# Patient Record
Sex: Female | Born: 1970 | Hispanic: Yes | Marital: Married | Smoking: Never smoker
Health system: Southern US, Community
[De-identification: ages and names within clinical notes are randomized; demographics above are authoritative.]

---

## 2009-03-25 ENCOUNTER — Ambulatory Visit: Payer: Self-pay | Admitting: Certified Nurse Midwife

## 2009-06-24 ENCOUNTER — Ambulatory Visit: Payer: Self-pay | Admitting: Certified Nurse Midwife

## 2009-08-20 ENCOUNTER — Inpatient Hospital Stay: Payer: Self-pay | Admitting: Obstetrics and Gynecology

## 2009-10-15 ENCOUNTER — Ambulatory Visit: Payer: Self-pay | Admitting: Certified Nurse Midwife

## 2013-11-05 ENCOUNTER — Ambulatory Visit: Payer: Self-pay

## 2014-10-19 ENCOUNTER — Ambulatory Visit: Payer: Self-pay

## 2015-04-30 ENCOUNTER — Ambulatory Visit
Admission: RE | Admit: 2015-04-30 | Discharge: 2015-04-30 | Disposition: A | Discharge status: Home / Self Care | Payer: Self-pay | Source: Ambulatory Visit | Attending: Family Medicine | Admitting: Family Medicine

## 2015-04-30 ENCOUNTER — Other Ambulatory Visit: Payer: Self-pay | Admitting: Family Medicine

## 2015-04-30 DIAGNOSIS — M25512 Pain in left shoulder: Secondary | ICD-10-CM

## 2017-02-14 ENCOUNTER — Other Ambulatory Visit: Payer: Self-pay | Admitting: Family Medicine

## 2017-03-19 ENCOUNTER — Ambulatory Visit: Payer: Self-pay | Attending: Oncology

## 2017-05-16 ENCOUNTER — Ambulatory Visit: Payer: Self-pay

## 2017-07-18 ENCOUNTER — Ambulatory Visit: Payer: Self-pay | Attending: Oncology

## 2017-07-18 ENCOUNTER — Ambulatory Visit
Admission: RE | Admit: 2017-07-18 | Discharge: 2017-07-18 | Disposition: A | Discharge status: Home / Self Care | Payer: Self-pay | Source: Ambulatory Visit | Attending: Oncology | Admitting: Oncology

## 2017-07-18 VITALS — BP 114/74 | HR 83 | Temp 98.9°F | Ht 63.0 in | Wt 112.0 lb

## 2017-07-18 DIAGNOSIS — Z0000 Encounter for general adult medical examination without abnormal findings: Secondary | ICD-10-CM

## 2017-07-18 NOTE — Progress Notes (Signed)
Subjective:     Patient ID: Breanna Hamilton, female   DOB: 25-Sep-1970, 46 y.o.   MRN: [id number]  HPI   Review of Systems     Objective:   Physical Exam  Pulmonary/Chest: Right breast exhibits no inverted nipple, no mass, no nipple discharge, no skin change and no tenderness. Left breast exhibits no inverted nipple, no mass, no nipple discharge, no skin change and no tenderness. Breasts are symmetrical.       Assessment:     46 year old hispanic patient presents for [facility] clinic.  Patient screened, and meets [facility] eligibility.  Patient does not have insurance, Medicare or Medicaid.  Handout given on Affordable Care Act. Instructed patient on breast self-exam using teach back method.  CBE unremarkable.  No mass or lump palpated. Hiram interpreted exam    Plan:     Sent for bilateral screening mammogram.

## 2017-07-23 ENCOUNTER — Other Ambulatory Visit: Payer: Self-pay | Admitting: *Deleted

## 2017-07-23 DIAGNOSIS — N6489 Other specified disorders of breast: Secondary | ICD-10-CM

## 2017-08-02 ENCOUNTER — Ambulatory Visit
Admission: RE | Admit: 2017-08-02 | Discharge: 2017-08-02 | Disposition: A | Discharge status: Home / Self Care | Payer: Self-pay | Source: Ambulatory Visit | Attending: Oncology | Admitting: Oncology

## 2017-08-02 DIAGNOSIS — N6489 Other specified disorders of breast: Secondary | ICD-10-CM

## 2017-08-03 NOTE — Progress Notes (Signed)
Radiologist reviewed benign findings with patient. Letter mailed from [facility] to notify of normal mammogram results.  Patient to return in one year for annual screening. Copy to [facility].

## 2018-11-11 ENCOUNTER — Ambulatory Visit: Payer: Self-pay

## 2018-11-27 ENCOUNTER — Ambulatory Visit: Payer: Self-pay

## 2019-02-19 IMAGING — MG MM DIGITAL SCREENING BILAT W/ TOMO W/ CAD
9 of 13 series · 9 of 29 positions shown · non-contrast
Comparison: Previous exam(s).

CLINICAL DATA: Screening.

EXAM:
2D DIGITAL SCREENING BILATERAL MAMMOGRAM WITH CAD AND ADJUNCT TOMO

[L MLO (1 of 2)]
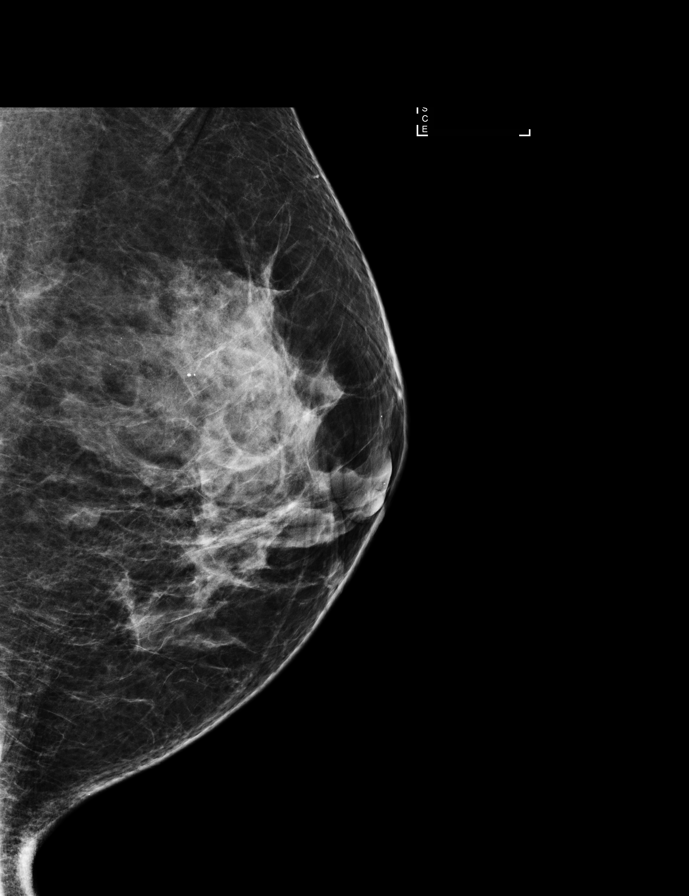

[R CC]
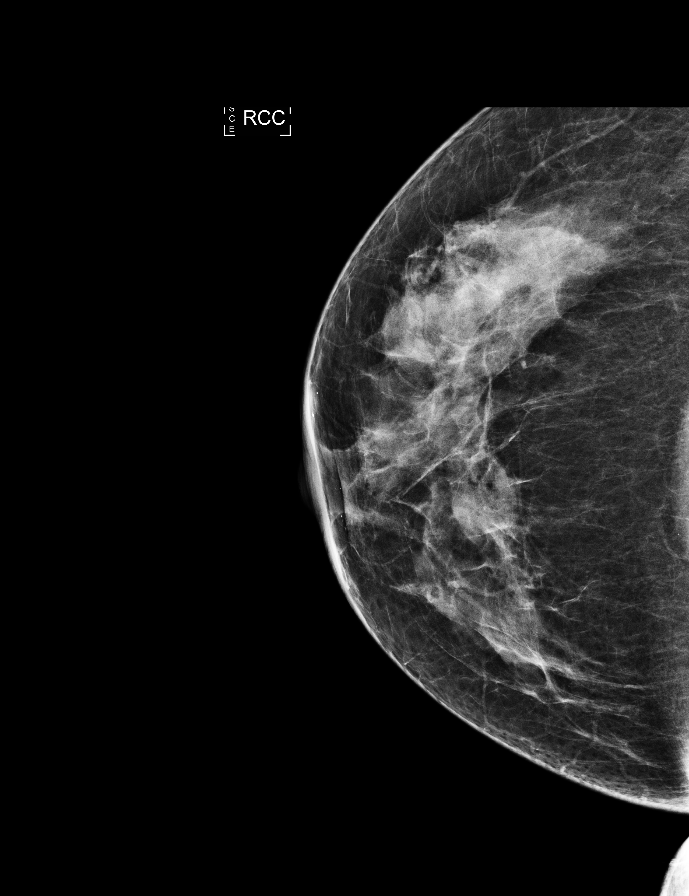

[L CC synth-2D]
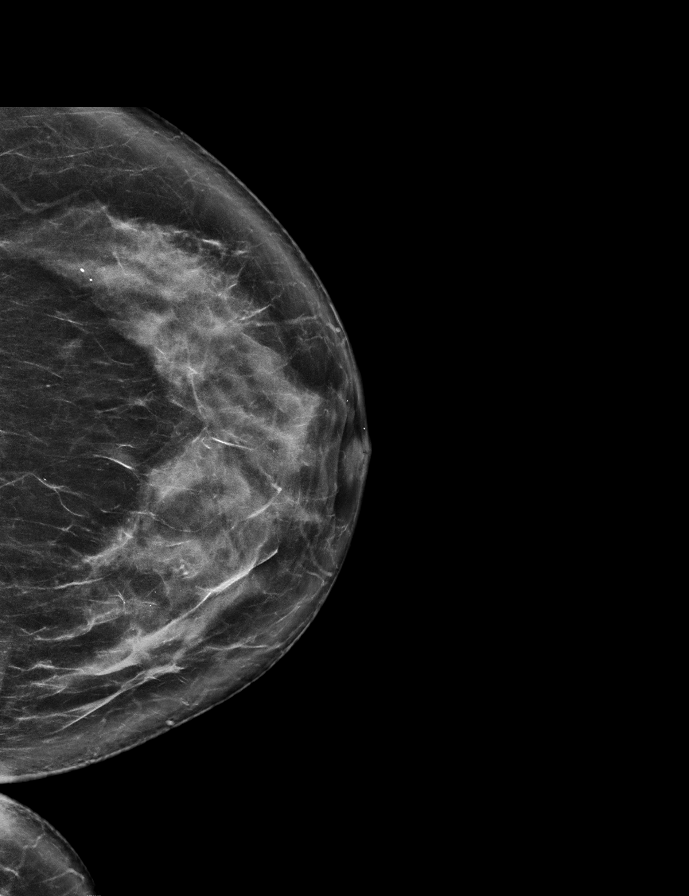

[R MLO]
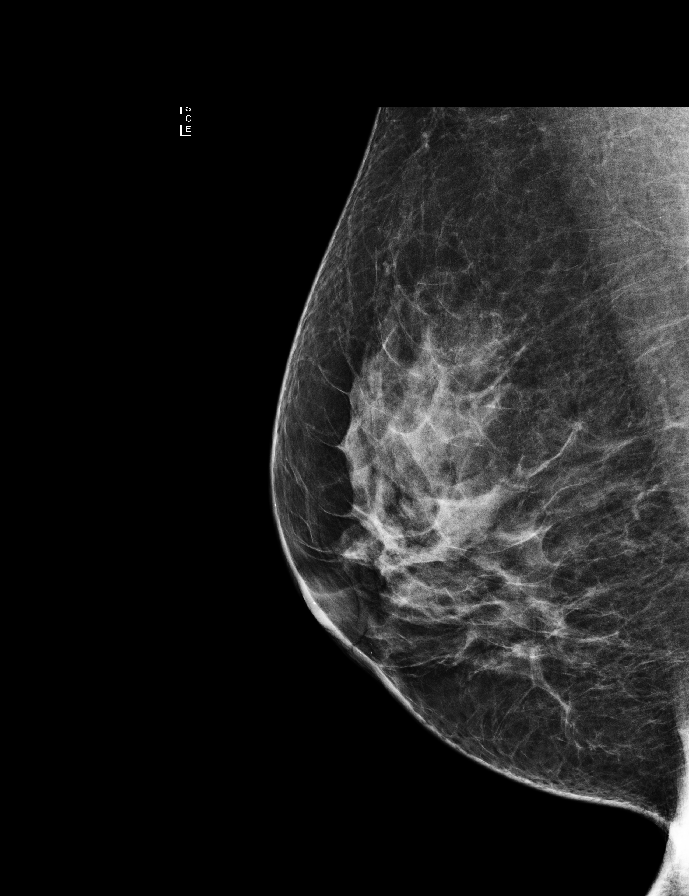

[R CC synth-2D]
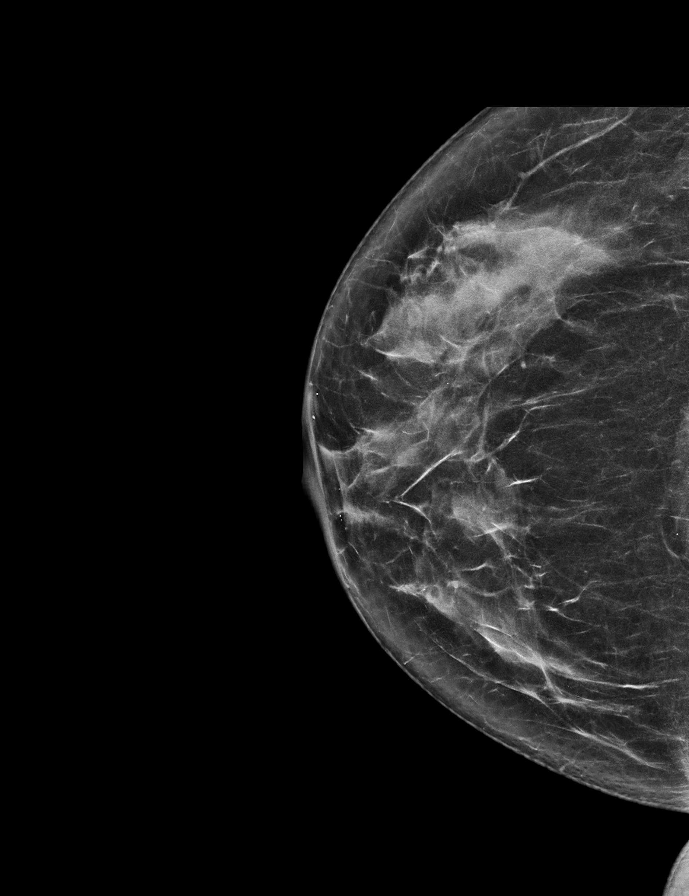

[R MLO synth-2D]
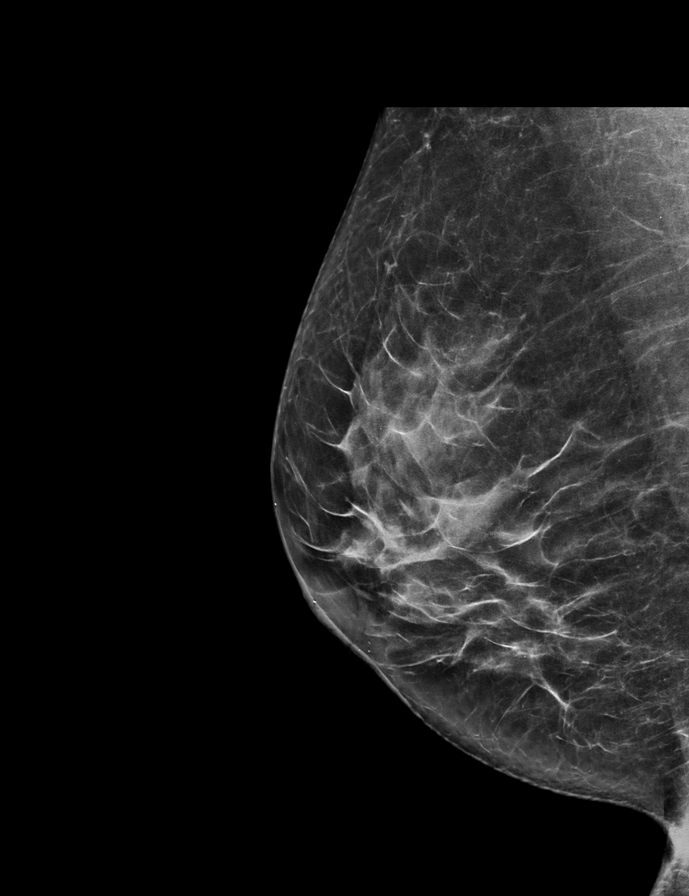

[L MLO synth-2D]
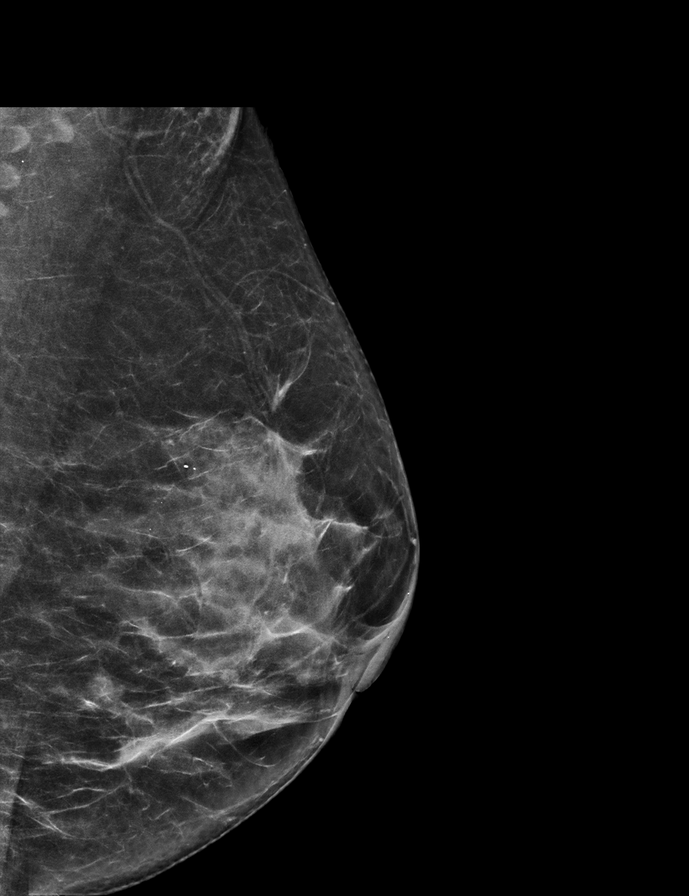

[L CC]
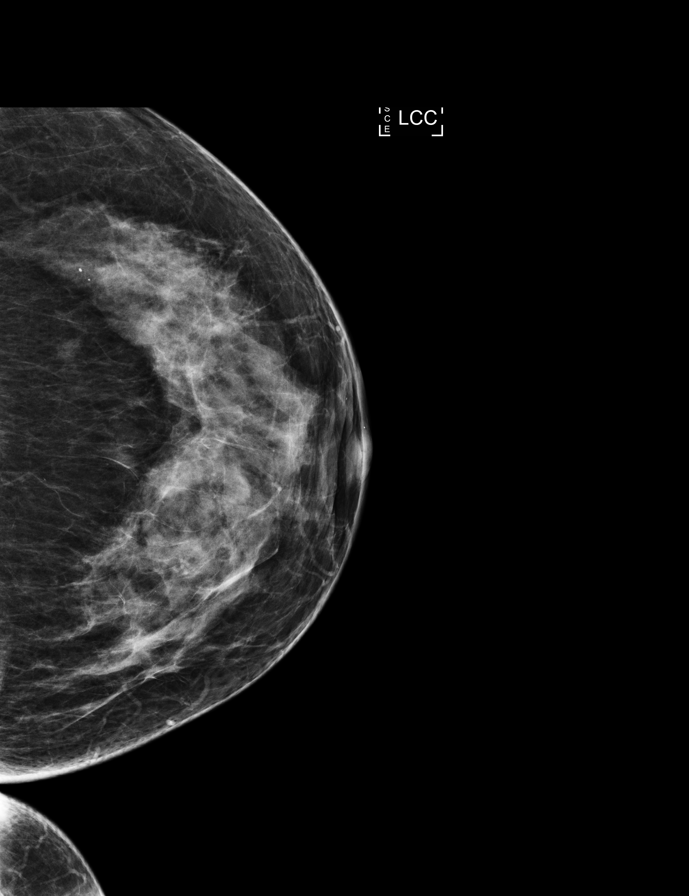

[L MLO (2 of 2)]
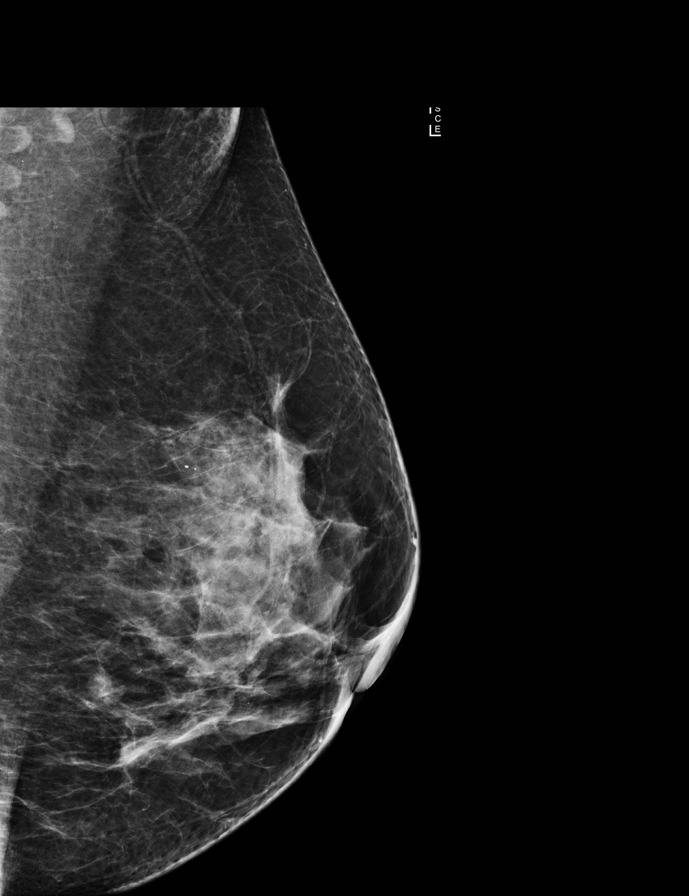

[9 of 29 positions shown; findings below may reference images not displayed]

ACR Breast Density Category c: The breast tissue is heterogeneously
dense, which may obscure small masses.
FINDINGS: In the left breast, a possible asymmetry warrants further
evaluation. In the right breast, no findings suspicious for
malignancy. Images were processed with CAD.
IMPRESSION: Further evaluation is suggested for possible asymmetry in the left
breast.

RECOMMENDATION:
Diagnostic mammogram and possibly ultrasound of the left breast.
(Code:82-[id number])

The patient will be contacted regarding the findings, and additional
imaging will be scheduled.

BI-RADS CATEGORY  0: Incomplete. Need additional imaging evaluation
and/or prior mammograms for comparison.

## 2019-05-06 ENCOUNTER — Other Ambulatory Visit: Payer: Self-pay

## 2019-05-06 ENCOUNTER — Telehealth: Payer: Self-pay

## 2019-05-06 NOTE — Telephone Encounter (Signed)
Attempted pre-visit screening call ([facility] appointment 05/07/2019) with interpreter Jacqui. No answer / no voicemail at home #. A person called back to the interpreter line saying that she was unavailable.

## 2019-05-07 ENCOUNTER — Other Ambulatory Visit: Payer: Self-pay | Admitting: *Deleted

## 2019-05-07 ENCOUNTER — Ambulatory Visit: Payer: Self-pay | Attending: Oncology | Admitting: *Deleted

## 2019-05-07 ENCOUNTER — Encounter: Payer: Self-pay | Admitting: *Deleted

## 2019-05-07 ENCOUNTER — Other Ambulatory Visit: Payer: Self-pay

## 2019-05-07 VITALS — BP 127/79 | HR 64 | Temp 99.0°F | Ht 62.0 in | Wt 103.0 lb

## 2019-05-07 DIAGNOSIS — N644 Mastodynia: Secondary | ICD-10-CM

## 2019-05-07 NOTE — Progress Notes (Signed)
  Subjective:     Patient ID: Breanna Hamilton, female   DOB: [birth year], 48 y.o.   MRN: [id number]  HPI   Review of Systems     Objective:   Physical Exam Chest:     Breasts:        Right: No swelling, bleeding, inverted nipple, mass, nipple discharge, skin change or tenderness.        Left: Tenderness present. No swelling, bleeding, inverted nipple, mass, nipple discharge or skin change.    Lymphadenopathy:     Upper Body:     Right upper body: No supraclavicular or axillary adenopathy.     Left upper body: No supraclavicular or axillary adenopathy.        Assessment:     48 year old Hispanic female returns to [facility] for annual screening.  Breanna Hamilton, the interpreter present during the interview and exam.  Patient complains of intermittent left breast pain. States it occurs daily, multiple times a day, lasting only seconds.  Rates the pain a 4 on a 0/10 scale.  Denies any aggravating or alleviating factors.  Patient has a very flat affect and is a poor historian.  On clinical breast exam I can palpate her ribs at the area of concern.  The rib area is tender to palpation.  There is no dominant mass, skin changes, nipple discharge or lymphadenopathy.  She denies any trauma to the area.  Taught self breast awareness.  Last pap on 01/29/17 was negative / no HPV co-testing.  Next pap due in 2021.  Patient has been screened for eligibility.  She does not have any insurance, Medicare or Medicaid.  She also meets financial eligibility.  Hand-out given on the Affordable Care Act.  Risk Assessment    Risk Scores      05/07/2019   Last edited by: Breanna Demark, RN   5-year risk: 0.7 %   Lifetime risk: 7.4 %            Plan:     Will order diagnostic mammogram and ultrasound for targeted breast pain.  Breanna Hamilton to call patient with her appointment.

## 2019-05-07 NOTE — Patient Instructions (Signed)
Gave patient hand-out, Women Staying Healthy, Active and Well from [facility], with education on breast health, pap smears, heart and colon health. 

## 2019-05-08 ENCOUNTER — Telehealth: Payer: Self-pay | Admitting: *Deleted

## 2019-05-16 ENCOUNTER — Encounter: Payer: Self-pay | Admitting: Radiology

## 2019-05-16 ENCOUNTER — Ambulatory Visit
Admission: RE | Admit: 2019-05-16 | Discharge: 2019-05-16 | Disposition: A | Discharge status: Home / Self Care | Payer: Self-pay | Source: Ambulatory Visit | Attending: Oncology | Admitting: Oncology

## 2019-05-16 DIAGNOSIS — N644 Mastodynia: Secondary | ICD-10-CM

## 2019-05-21 ENCOUNTER — Encounter: Payer: Self-pay | Admitting: *Deleted

## 2019-05-21 NOTE — Progress Notes (Signed)
Letter mailed from the [place] to inform patient of her normal mammogram results.  Since no findings on clinical breast exam and normal mammogram,  patient is to follow-up with annual screening in one year.

## 2020-01-20 DIAGNOSIS — Z124 Encounter for screening for malignant neoplasm of cervix: Secondary | ICD-10-CM | POA: Diagnosis not present

## 2020-01-21 ENCOUNTER — Other Ambulatory Visit: Payer: Self-pay | Admitting: Primary Care

## 2020-01-21 DIAGNOSIS — N644 Mastodynia: Secondary | ICD-10-CM

## 2020-02-12 ENCOUNTER — Encounter: Payer: Self-pay | Admitting: Family Medicine

## 2020-02-12 ENCOUNTER — Ambulatory Visit
Admission: RE | Admit: 2020-02-12 | Discharge: 2020-02-12 | Disposition: A | Discharge status: Home / Self Care | Payer: Managed Care, Other (non HMO) | Source: Ambulatory Visit | Attending: Primary Care | Admitting: Primary Care

## 2020-02-12 DIAGNOSIS — N644 Mastodynia: Secondary | ICD-10-CM

## 2020-02-12 DIAGNOSIS — R922 Inconclusive mammogram: Secondary | ICD-10-CM | POA: Diagnosis not present

## 2020-04-20 ENCOUNTER — Telehealth: Payer: Self-pay | Admitting: Obstetrics & Gynecology

## 2020-04-20 NOTE — Telephone Encounter (Signed)
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 Called and left voicemail for patient to call back to be scheduled.

## 2020-05-18 ENCOUNTER — Ambulatory Visit (INDEPENDENT_AMBULATORY_CARE_PROVIDER_SITE_OTHER): Payer: Managed Care, Other (non HMO) | Admitting: Obstetrics and Gynecology

## 2020-05-18 ENCOUNTER — Other Ambulatory Visit: Payer: Self-pay

## 2020-05-18 ENCOUNTER — Encounter: Payer: Self-pay | Admitting: Obstetrics and Gynecology

## 2020-05-18 VITALS — BP 130/72 | HR 83 | Resp 16 | Ht 60.0 in | Wt 101.6 lb

## 2020-05-18 DIAGNOSIS — N760 Acute vaginitis: Secondary | ICD-10-CM

## 2020-05-18 DIAGNOSIS — N8110 Cystocele, unspecified: Secondary | ICD-10-CM

## 2020-05-18 DIAGNOSIS — R300 Dysuria: Secondary | ICD-10-CM

## 2020-05-18 DIAGNOSIS — R32 Unspecified urinary incontinence: Secondary | ICD-10-CM

## 2020-05-18 NOTE — Patient Instructions (Signed)
Menopausia y terapia de reemplazo hormonal Menopause and Hormone Replacement Therapy La menopausia es la etapa normal de la vida cuando los perodos menstruales desaparecen y los ovarios dejan de producir las hormonas femeninas estrgeno y Education officer, museum. Esta falta de hormonas puede afectar la salud y causar sntomas indeseables. La terapia de reemplazo hormonal (TRH) puede aliviar algunos de esos sntomas. Qu es la terapia de reemplazo hormonal? La terapia de reemplazo hormonal (TRH) es el uso de hormonas artificiales (sintticas) para Microbiologist las hormonas que el cuerpo deja de producir al llegar a la menopausia. Cules son mis opciones para Neomia Dear TRH?  La TRH puede consistir en hormonas sintticas de Astronomer y Education officer, museum o puede consistir solamente en estrgeno (terapia de estrgenos solamente). Usted y su mdico decidirn qu tipo de TRH es la ms [place] para usted. Si elige Winferd Humphrey TRH y tiene tero, generalmente se prescribe estrgeno y progesterona. La terapia de estrgenos solamente se [place] para las mujeres que no tienen tero. Las siguientes son algunas de las opciones para Neomia Dear TRH:  Pastillas.  Parches.  Geles.  Aerosoles.  Cremas vaginales.  Anillo vaginal.  Dispositivos vaginales. La cantidad de hormonas que tome y el tiempo que deba tomarlas vara en funcin de su salud. Es importante lo siguiente:  Comenzar una TRH con la dosis ms baja posible.  Interrumpa la TRH tan pronto como su mdico se lo indique.  Colabore con su mdico para estar bien informada y sentirse cmoda con sus decisiones. Cules son los beneficios de la TRH? La TRH puede reducir la frecuencia y la gravedad de los sntomas de la menopausia. Los beneficios de la TRH varan segn el tipo de sntomas que tenga, su gravedad y su salud general. La TRH puede ayudarle a mejorar los siguientes sntomas de la menopausia:  Acaloramiento y sudores nocturnos. Estos consisten en una sensacin de calor  que se extiende por el rostro y el cuerpo. La piel se puede volver roja, como ruborizada. Los sudores nocturnos son acaloramientos que ocurren al estar dormida o tratando de dormir.  Prdida de masa sea (osteoporosis). El organismo pierde calcio ms rpido despus de la menopausia, lo que debilita a los [place]. Esto puede aumentar el riesgo de huesos rotos (fracturas).  Sequedad vaginal. La membrana que recubre la vagina puede volverse ms delgada y [place], lo que puede causar dolor durante las relaciones sexuales o causar infecciones, ardor o picazn.  Infecciones de las vas urinarias.  Incontinencia urinaria. Es la incapacidad para Producer, television/film/video.  Irritabilidad.  Problemas de memoria de [employer] Cules son los riesgos de la TRH? Los riesgos de la TRH pueden variar segn su salud y sus antecedentes mdicos. Los riesgos de la TRH tambin dependen de si usted recibe estrgeno y progesterona o solamente estrgeno. La TRH puede aumentar el riesgo de:  Tener prdidas. Estas ocurren cuando la vagina pierde repentinamente una pequea cantidad de [place].  Cncer de endometrio. Este cncer ocurre en la membrana del tero (endometrio).  Cncer de mama.  Aumenta la densidad del tejido de las [place]. Esto puede dificultar la deteccin de cncer de mamas con una radiografa de mamas (mamografa).  Accidente cerebrovascular.  Enfermedad cardaca.  Cogulos de [place].  Enfermedad de la vescula biliar.  Enfermedad heptica. Los riesgos de la TRH pueden ser [employer] si tiene alguna de las siguientes afecciones:  Database administrator de endometrio.  Enfermedad heptica.  Enfermedad cardaca.  Cncer de mama.  Antecedentes de cogulos sanguneos.  Antecedentes de accidente cerebrovascular. Siga estas instrucciones en  su casa:  Tome los medicamentos de venta libre y los recetados solamente como se lo haya indicado el mdico.  Hgase mamografas, exmenes plvicos y chequeos con la  frecuencia que le indique el mdico.  Hgase pruebas de prueba de Papanicolaou con la frecuencia que le indique el mdico. A esta prueba tambin se la denomina Pap. Es una prueba de deteccin que se [place] para detectar signos de cncer en el cuello uterino y la vagina. La prueba de prueba de Papanicolaou tambin puede identificar la presencia de infeccin o cambios precancerosos. Las pruebas de Papanicolaou se pueden realizar con la siguiente frecuencia: ? Cada 3 aos, a partir de los [address]. ? Cada 5 aos, a partir de los [address] [place], en combinacin con las pruebas que se realizan para Landscape architect presencia del virus del Geneticist, molecular (VPH). ? Con mayor o menor frecuencia segn las afecciones mdicas que tenga, su edad y otros factores de [place].  Es su responsabilidad retirar los [employer] de la prueba de Papanicolaou. Consulte al mdico o pregunte en el departamento donde se realiza la prueba cundo estarn [employer].  Concurra a todas las visitas de [address] se lo haya indicado el mdico. Esto es importante. Comunquese con un mdico si tiene:  Dolor o hinchazn en las piernas.  Falta de aire.  Dolor en el pecho.  Bultos o cambios en las mamas o en las [place].  Habla arrastrando las palabras.  Dolor, ardor o sangrado al [employer].  Hemorragia vaginal inusual.  Mareos o dolores de [place].  Adormecimiento o debilidad en cualquier parte de los brazos o de las piernas.  Dolor en el abdomen. Resumen  La menopausia es la etapa normal de la vida cuando los perodos [employer] desaparecen y los ovarios dejan de producir las hormonas femeninas estrgeno y Education officer, museum.  La terapia de reemplazo hormonal (TRH) puede aliviar algunos de los sntomas de la menopausia.  La TRH puede reducir la frecuencia y la gravedad de los sntomas de la menopausia.  Los riesgos de la TRH pueden variar segn su salud y sus antecedentes mdicos. Esta informacin no tiene Microbiologist el consejo del mdico. Asegrese de hacerle al mdico cualquier pregunta que tenga. Document Revised: 05/09/2018 Document Reviewed: 05/09/2018 Elsevier Patient Education  2020 [employer].

## 2020-05-18 NOTE — Progress Notes (Signed)
   Patient ID: Breanna Hamilton, female   DOB: 02-07-1971, 49 y.o.   MRN: [id number]  Reason for Consult: Gynecologic Exam   Referred by Sandrea Hughs, NP  Subjective:     HPI:  Breanna Hamilton is a 49 y.o. female. She reports symptoms of hot flashes. She notes enuresis. She notes vaginal pressure. She also notes vulvar pain with intercourse.  History reviewed. No pertinent past medical history. Family History  Problem Relation Age of Onset  . Cancer Mother    History reviewed. No pertinent surgical history.  Short Social History:  Social History   Tobacco Use  . Smoking status: Never Smoker  . Smokeless tobacco: Never Used  Substance Use Topics  . Alcohol use: Never    No Known Allergies  No current outpatient medications on file.   No current facility-administered medications for this visit.    REVIEW OF SYSTEMS      Objective:  Objective   Vitals:   05/18/20 1538  BP: 130/72  Pulse: 83  Resp: 16  SpO2: 99%  Weight: 101 lb 9.6 oz (46.1 kg)  Height: 5' (1.524 m)   Body mass index is 19.84 kg/m.  Physical Exam      Assessment/Plan:     49 yo Stage 2 prolapse- not symptomatic Trial of steroids for lichen sclerosis started at [facility] Will check for infection  Reports enuresis- will check with urology Urine culture today  Discussed HRT for hot flashes and night sweats if desired.    Adelene Idler MD [facility], [facility] 05/18/2020 4:38 PM

## 2020-05-19 DIAGNOSIS — R32 Unspecified urinary incontinence: Secondary | ICD-10-CM | POA: Diagnosis not present

## 2020-05-21 LAB — URINE CULTURE

## 2020-05-21 NOTE — Progress Notes (Signed)
Called pt, no answer, [facility]. 

## 2020-05-21 NOTE — Progress Notes (Signed)
Please call patient and inform her of negative urine culture. Thank you.

## 2020-05-22 LAB — NUSWAB BV AND CANDIDA, NAA
Atopobium vaginae: HIGH Score — AB
BVAB 2: HIGH Score — AB
Candida albicans, NAA: NEGATIVE
Candida glabrata, NAA: NEGATIVE
Megasphaera 1: HIGH Score — AB

## 2020-05-24 NOTE — Progress Notes (Signed)
Called pt, no answer, [facility]. 

## 2020-06-03 ENCOUNTER — Other Ambulatory Visit: Payer: Self-pay | Admitting: Obstetrics and Gynecology

## 2020-07-29 ENCOUNTER — Encounter: Payer: Self-pay | Admitting: Obstetrics and Gynecology

## 2020-07-29 ENCOUNTER — Other Ambulatory Visit: Payer: Self-pay

## 2020-07-29 ENCOUNTER — Ambulatory Visit (INDEPENDENT_AMBULATORY_CARE_PROVIDER_SITE_OTHER): Payer: 59 | Admitting: Obstetrics and Gynecology

## 2020-07-29 VITALS — BP 110/70 | Ht 60.0 in | Wt 99.8 lb

## 2020-07-29 DIAGNOSIS — L900 Lichen sclerosus et atrophicus: Secondary | ICD-10-CM

## 2020-07-29 MED ORDER — CLOBETASOL PROPIONATE 0.05 % EX OINT: TOPICAL_OINTMENT | CUTANEOUS | 5 refills | Status: AC

## 2020-07-29 NOTE — Progress Notes (Signed)
Patient ID: Breanna Hamilton, female   DOB: May 18, 1971, 49 y.o.   MRN: [id number]  Reason for Consult: Gynecologic Exam   Referred by Oswaldo Conroy, MD  Subjective:     HPI:  Breanna Hamilton is a 49 y.o. female she is following up today for GYN visit she reports that this morning she had intercourse and it was very painful.  She was seen previously and prescribed clobetasol.  She reports that she applied this ointment twice a day for 2 weeks.  And then discontinued.  She had some skin changes at that time consistent with lichen sclerosis which is why this medication has been prescribed.  She did report that the intercourse that this morning was very forceful.  She still has some issues with hot flashes especially at night.   History reviewed. No pertinent past medical history. Family History  Problem Relation Age of Onset  . Cancer Mother    History reviewed. No pertinent surgical history.  Short Social History:  Social History   Tobacco Use  . Smoking status: Never Smoker  . Smokeless tobacco: Never Used  Substance Use Topics  . Alcohol use: Never    No Known Allergies  No current outpatient medications on file.   No current facility-administered medications for this visit.    Review of Systems  Constitutional: Negative for chills, fatigue, fever and unexpected weight change.  HENT: Negative for trouble swallowing.  Eyes: Negative for loss of vision.  Respiratory: Negative for cough, shortness of breath and wheezing.  Cardiovascular: Negative for chest pain, leg swelling, palpitations and syncope.  GI: Negative for abdominal pain, blood in stool, diarrhea, nausea and vomiting.  GU: Negative for difficulty urinating, dysuria, frequency and hematuria.  Musculoskeletal: Negative for back pain, leg pain and joint pain.  Skin: Negative for rash.  Neurological: Negative for dizziness, headaches, light-headedness, numbness and seizures.  Psychiatric: Negative  for behavioral problem, confusion, depressed mood and sleep disturbance.        Objective:  Objective   Vitals:   07/29/20 1529  BP: 110/70  Weight: 99 lb 12.8 oz (45.3 kg)  Height: 5' (1.524 m)   Body mass index is 19.49 kg/m.  Physical Exam Vitals and nursing note reviewed. Exam conducted with a chaperone present.  Constitutional:      Appearance: She is well-developed.  HENT:     Head: Normocephalic and atraumatic.  Eyes:     Pupils: Pupils are equal, round, and reactive to light.  Cardiovascular:     Rate and Rhythm: Normal rate and regular rhythm.  Pulmonary:     Effort: Pulmonary effort is normal. No respiratory distress.  Genitourinary:    Comments: External: Vulva Normal. Red abrasion noted along the left hymenal edge and the introitus.  Skin:    General: Skin is warm and dry.  Neurological:     Mental Status: She is alert and oriented to person, place, and time.  Psychiatric:        Behavior: Behavior normal.        Thought Content: Thought content normal.        Judgment: Judgment normal.     Assessment/Plan:     49 year old with vaginal abrasion after intercourse. Recommended sitz bath's and application of Neosporin as needed for pain. Patient given list of vaginal lubricants to use with intercourse in the future Discussed proper application of clobetasol ointment.  Skin has improved in appearance and recommended patient apply clobetasol twice a week long-term to  maintain her skin tissues. Patient was given information regarding hormone replacement therapy which we can discuss further at her next visit if she would like to pursue this treatment option for her vasomotor symptoms of menopause.    Adelene Idler MD [facility], [facility] 07/29/2020 4:37 PM

## 2020-07-29 NOTE — Patient Instructions (Addendum)
Vaginal/ Vulvar Moisturizer Use 3-5 times a week at bedtime  Hyalo Gyn  Revaree  Replens  Carlson Key-E suppositories  Vitamin E oil, olive oil, coconut oil   Water-based Lubricants  Astroglide [place] Jelly  Luvena Aquagel  Silicone- based Lubricants Pjur PINK Astroglide silicone Uberlube  Menopausia y terapia de reemplazo hormonal Menopause and Hormone Replacement Therapy La menopausia es la etapa normal de la vida cuando los perodos [employer] desaparecen y los ovarios dejan de producir las hormonas femeninas estrgeno y Education officer, museum. Esta falta de hormonas puede afectar la salud y causar sntomas indeseables. La terapia de reemplazo hormonal (TRH) puede aliviar algunos de esos sntomas. Qu es la terapia de reemplazo hormonal? La terapia de reemplazo hormonal (TRH) es el uso de hormonas artificiales (sintticas) para Microbiologist las hormonas que el cuerpo deja de producir al llegar a la menopausia. Cules son mis opciones para Neomia Dear TRH?  La TRH puede consistir en hormonas sintticas de Astronomer y Education officer, museum o puede consistir solamente en estrgeno (terapia de estrgenos solamente). Usted y su mdico decidirn qu tipo de TRH es la ms [place] para usted. Si elige Winferd Humphrey TRH y tiene tero, generalmente se prescribe estrgeno y progesterona. La terapia de estrgenos solamente se [place] para las mujeres que no tienen tero. Las siguientes son algunas de las opciones para Neomia Dear TRH:  Pastillas.  Parches.  Geles.  Aerosoles.  Cremas vaginales.  Anillo vaginal.  Dispositivos vaginales. La cantidad de hormonas que tome y el tiempo que deba tomarlas vara en funcin de su salud. Es importante lo siguiente:  Comenzar una TRH con la dosis ms baja posible.  Interrumpa la TRH tan pronto como su mdico se lo indique.  Colabore con su mdico para estar bien informada y sentirse cmoda con sus decisiones. Cules son los beneficios de la TRH? La TRH puede reducir la  frecuencia y la gravedad de los sntomas de la menopausia. Los beneficios de la TRH varan segn el tipo de sntomas que tenga, su gravedad y su salud general. La TRH puede ayudarle a mejorar los siguientes sntomas de la menopausia:  Acaloramiento y sudores nocturnos. Estos consisten en una sensacin de calor que se extiende por el rostro y el cuerpo. La piel se puede volver roja, como ruborizada. Los sudores nocturnos son acaloramientos que ocurren al estar dormida o tratando de dormir.  Prdida de masa sea (osteoporosis). El organismo pierde calcio ms rpido despus de la menopausia, lo que debilita a los [place]. Esto puede aumentar el riesgo de huesos rotos (fracturas).  Sequedad vaginal. La membrana que recubre la vagina puede volverse ms delgada y [place], lo que puede causar dolor durante las relaciones sexuales o causar infecciones, ardor o picazn.  Infecciones de las vas urinarias.  Incontinencia urinaria. Es la incapacidad para Producer, television/film/video.  Irritabilidad.  Problemas de memoria de [employer]. Cules son los riesgos de la TRH? Los riesgos de la TRH pueden variar segn su salud y sus antecedentes mdicos. Los riesgos de la TRH tambin dependen de si usted recibe estrgeno y progesterona o solamente estrgeno. La TRH puede aumentar el riesgo de:  Tener prdidas. Estas ocurren cuando la vagina pierde repentinamente una pequea cantidad de [place].  Cncer de endometrio. Este cncer ocurre en la membrana del tero (endometrio).  Cncer de mama.  Aumenta la densidad del tejido de las [place]. Esto puede dificultar la deteccin de cncer de mamas con una radiografa de mamas (mamografa).  Accidente cerebrovascular.  Enfermedad cardaca.  Cogulos de [place].  Enfermedad  de la vescula biliar.  Enfermedad heptica. Los riesgos de la TRH pueden ser [employer] si tiene alguna de las siguientes afecciones:  Database administrator de endometrio.  Enfermedad heptica.  Enfermedad  cardaca.  Cncer de mama.  Antecedentes de cogulos sanguneos.  Antecedentes de accidente cerebrovascular. Siga estas instrucciones en su casa:  Tome los medicamentos de venta libre y los recetados solamente como se lo haya indicado el mdico.  Hgase mamografas, exmenes plvicos y chequeos con la frecuencia que le indique el mdico.  Hgase pruebas de prueba de Papanicolaou con la frecuencia que le indique el mdico. A esta prueba tambin se la denomina Pap. Es una prueba de deteccin que se [place] para detectar signos de cncer en el cuello uterino y la vagina. La prueba de prueba de Papanicolaou tambin puede identificar la presencia de infeccin o cambios precancerosos. Las pruebas de Papanicolaou se pueden realizar con la siguiente frecuencia: ? Cada 3 aos, a partir de los [address]. ? Cada 5 aos, a partir de los [address] [place], en combinacin con las pruebas que se realizan para Landscape architect presencia del virus del Geneticist, molecular (VPH). ? Con mayor o menor frecuencia segn las afecciones mdicas que tenga, su edad y otros factores de [place].  Es su responsabilidad retirar los [employer] de la prueba de Papanicolaou. Consulte al mdico o pregunte en el departamento donde se realiza la prueba cundo estarn [employer].  Concurra a todas las visitas de [address] se lo haya indicado el mdico. Esto es importante. Comunquese con un mdico si tiene:  Dolor o hinchazn en las piernas.  Falta de aire.  Dolor en el pecho.  Bultos o cambios en las mamas o en las [place].  Habla arrastrando las palabras.  Dolor, ardor o sangrado al [employer].  Hemorragia vaginal inusual.  Mareos o dolores de [place].  Adormecimiento o debilidad en cualquier parte de los brazos o de las piernas.  Dolor en el abdomen. Resumen  La menopausia es la etapa normal de la vida cuando los perodos B[employer]on[employer] desaparecen y los ovarios dejan de producir las hormonas femeninas estrgeno y  Education officer, museum.  La terapia de reemplazo hormonal (TRH) puede aliviar algunos de los sntomas de la menopausia.  La TRH puede reducir la frecuencia y la gravedad de los sntomas de la menopausia.  Los riesgos de la TRH pueden variar segn su salud y sus antecedentes mdicos. Esta informacin no tiene Theme park manager el consejo del mdico. Asegrese de hacerle al mdico cualquier pregunta que tenga. Document Revised: 05/09/2018 Document Reviewed: 05/09/2018 Elsevier Patient Education  2020 [employer].

## 2020-07-29 NOTE — Progress Notes (Signed)
PT STATES SHE HAS PAIN DURING SEX.

## 2020-08-23 ENCOUNTER — Encounter: Payer: Self-pay | Admitting: Obstetrics and Gynecology

## 2020-08-23 ENCOUNTER — Other Ambulatory Visit: Payer: Self-pay

## 2020-08-23 ENCOUNTER — Ambulatory Visit (INDEPENDENT_AMBULATORY_CARE_PROVIDER_SITE_OTHER): Payer: 59 | Admitting: Obstetrics and Gynecology

## 2020-08-23 VITALS — BP 104/64 | HR 70 | Ht 60.0 in

## 2020-08-23 DIAGNOSIS — N941 Unspecified dyspareunia: Secondary | ICD-10-CM

## 2020-08-23 DIAGNOSIS — N952 Postmenopausal atrophic vaginitis: Secondary | ICD-10-CM

## 2020-08-23 DIAGNOSIS — R300 Dysuria: Secondary | ICD-10-CM | POA: Diagnosis not present

## 2020-08-23 MED ORDER — INTRAROSA 6.5 MG VA INST: 6.5000 mg | VAGINAL_INSERT | Freq: Every day | VAGINAL | 11 refills | Status: DC

## 2020-08-23 NOTE — Progress Notes (Signed)
Patient ID: Breanna Hamilton, female   DOB: 10-Sep-1970, 49 y.o.   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 female. She reports she has been applying the clobetasol twice a week. She was able to purchase astroglide lubrication for intercourse. She has not had intercourse for the last month because of pain with intercourse. She read over information regarding hormone replacement therapy but would like to avoid those treatments at this time because of her concerns with breast abnormalities on recent mammograms.    History reviewed. No pertinent past medical history. Family History  Problem Relation Age of Onset  . Cancer Mother    History reviewed. No pertinent surgical history.  Short Social History:  Social History   Tobacco Use  . Smoking status: Never Smoker  . Smokeless tobacco: Never Used  Substance Use Topics  . Alcohol use: Never    No Known Allergies  Current Outpatient Medications  Medication Sig Dispense Refill  . clobetasol ointment (TEMOVATE) 0.05 % Apply to affected area twice a week on Monday and Thursday at bedtime. 30 g 5  . Prasterone (INTRAROSA) 6.5 MG INST Place 6.5 mg vaginally at bedtime. 30 each 11   No current facility-administered medications for this visit.    Review of Systems  Constitutional: Negative for chills, fatigue, fever and unexpected weight change.  HENT: Negative for trouble swallowing.  Eyes: Negative for loss of vision.  Respiratory: Negative for cough, shortness of breath and wheezing.  Cardiovascular: Negative for chest pain, leg swelling, palpitations and syncope.  GI: Negative for abdominal pain, blood in stool, diarrhea, nausea and vomiting.  GU: Negative for difficulty urinating, dysuria, frequency and hematuria.  Musculoskeletal: Negative for back pain, leg pain and joint  pain.  Skin: Negative for rash.  Neurological: Negative for dizziness, headaches, light-headedness, numbness and seizures.  Psychiatric: Negative for behavioral problem, confusion, depressed mood and sleep disturbance.        Objective:  Objective   Vitals:   08/23/20 0811  BP: 104/64  Pulse: 70  Height: 5' (1.524 m)   Body mass index is 19.49 kg/m.  Physical Exam Vitals and nursing note reviewed. Exam conducted with a chaperone present.  Constitutional:      Appearance: She is well-developed and well-nourished.  HENT:     Head: Normocephalic and atraumatic.  Eyes:     Extraocular Movements: EOM normal.     Pupils: Pupils are equal, round, and reactive to light.  Cardiovascular:     Rate and Rhythm: Normal rate and regular rhythm.  Pulmonary:     Effort: Pulmonary effort is normal. No respiratory distress.  Genitourinary:    Comments: External: Normal appearing vulva. No lesions noted.  Normal appearing skin. Resolution of lichen sclerosis changes.  Skin:    General: Skin is warm and dry.  Neurological:     Mental Status: She is alert and oriented to person, place, and time.  Psychiatric:        Mood and Affect: Mood and affect normal.        Behavior: Behavior normal.        Thought Content: Thought content normal.        Judgment: Judgment normal.     Assessment/Plan:     49 yo with complaints of dyspareunia Patient would like to not be on HRT because  of concerns regarding breast cancer risk.  She would prefer to try vaginal insert medication She will trial Intrarosa.  Continue 1-2 times a week application of clobetasol.   More than 25 minutes were spent face to face with the patient in the room, reviewing the medical record, labs and images, and coordinating care for the patient. The plan of management was discussed in detail and counseling was provided.     Adelene Idler MD [facility], [facility] 08/23/2020 8:46 AM

## 2020-08-23 NOTE — Patient Instructions (Signed)
Dyspareunia, Female Dyspareunia is pain that is associated with sexual activity. This can affect any part of the genitals or lower abdomen. There are many possible causes of this condition. In some cases, diagnosing the cause of dyspareunia can be difficult. This condition can be mild, moderate, or severe. Depending on the cause, dyspareunia may get better with treatment, but may return (recur) over time. What are the causes?  The cause of this condition is not always known. However, problems that affect the vulva, vagina, uterus, and other organs may cause dyspareunia. Common causes of this condition include:  Vaginal dryness.  Giving birth.  Infection.  Skin changes or conditions.  Side effects of medicines.  Endometriosis. This is when tissue that is like the lining of the uterus grows on the outside of the uterus.  Psychological conditions. These include depression, anxiety, or traumatic experiences.  Allergic reaction. What increases the risk? The following factors may make you more likely to develop this condition:  History of physical or sexual trauma.  Some medicines.  No longer having a monthly period (menopause).  Having recently given birth.  Taking baths using soaps that have perfumes. These can cause irritation.  Douching. What are the signs or symptoms? The main symptom of this condition is pain in any part of your genitals or lower abdomen during or after sex. This may include:  Irritation, burning, or stinging sensations in your vulva.  Discomfort when your vulva or surrounding area is touched.  Aching and throbbing pain that may be constant.  Pain that gets worse when something is inserted into your vagina. How is this diagnosed? This condition may be diagnosed based on:  Your symptoms, including where and when your pain occurs.  Your medical history.  A physical exam. A pelvic exam will most likely be done.  Tests that include ultrasound,  blood tests, and tests that check the body for infection.  Imaging tests, such as X-ray, MRI, and CT scan. You may be referred to a health care provider who specializes in women's health (gynecologist). How is this treated? Treatment depends on the cause of your condition and your symptoms. In most cases, you may need to stop sexual activity until your symptoms go away or get better. Treatment may include:  Lubricants, ointments, and creams.  Physical therapy.  Massage therapy.  Hormonal therapy.  Medicines to: ? Prevent or fight infection. ? Relieve pain. ? Help numb the area. ? Treat depression (antidepressants).  Counseling, which may include sex therapy.  Surgery. Follow these instructions at home: Lifestyle  Wear cotton underwear.  Use water-based lubricants as needed during sex. Avoid oil-based lubricants.  Do not use any products that can cause irritation. This may include certain condoms, spermicides, lubricants, soaps, tampons, vaginal sprays, or douches.  Always practice safe sex. Use a condom to prevent sexually transmitted infections (STIs).  Talk freely with your partner about your condition. General instructions  Take or apply over-the-counter and prescription medicines only as told by your health care provider.  Urinate before you have sex.  Consider joining a support group.  Get the results of any tests you have done. Ask your health care provider, or the department that is doing the procedure, when your results will be ready.  Keep all follow-up visits as told by your health care provider. This is important. Contact a health care provider if:  You have vaginal bleeding after having sex.  You develop a lump at the opening of your vagina even if the   lump is painless.  You have: ? Abnormal discharge from your vagina. ? Vaginal dryness. ? Itchiness or irritation of your vulva or vagina. ? A new rash. ? Symptoms that get worse or do not improve  with treatment. ? A fever. ? Pain when you urinate. ? Blood in your urine. Get help right away if:  You have severe pain in your abdomen during or shortly after sex.  You pass out after sex. Summary  Dyspareunia is pain that is associated with sexual activity. This can affect any part of the genitals or lower abdomen.  There are many causes of this condition. Treatment depends on the cause and your symptoms. In most cases, you may need to stop sexual activity until your symptoms improve.  Take or apply over-the-counter and prescription medicines only as told by your health care provider.  Contact a health care provider if your symptoms get worse or do not improve with treatment.  Keep all follow-up visits as told by your health care provider. This is important. This information is not intended to replace advice given to you by your health care provider. Make sure you discuss any questions you have with your health care provider. Document Revised: 10/21/2018 Document Reviewed: 10/21/2018 Elsevier Patient Education  2020 Elsevier Inc.  

## 2020-11-12 ENCOUNTER — Other Ambulatory Visit: Payer: Self-pay | Admitting: Family Medicine

## 2020-11-12 ENCOUNTER — Ambulatory Visit
Admission: RE | Admit: 2020-11-12 | Discharge: 2020-11-12 | Disposition: A | Discharge status: Home / Self Care | Payer: Managed Care, Other (non HMO) | Source: Ambulatory Visit | Attending: Family Medicine | Admitting: Family Medicine

## 2020-11-12 ENCOUNTER — Other Ambulatory Visit: Payer: Self-pay

## 2020-11-12 DIAGNOSIS — M79645 Pain in left finger(s): Secondary | ICD-10-CM

## 2020-11-22 ENCOUNTER — Ambulatory Visit: Payer: Managed Care, Other (non HMO) | Admitting: Obstetrics and Gynecology

## 2020-12-27 ENCOUNTER — Other Ambulatory Visit (HOSPITAL_COMMUNITY)
Admission: RE | Admit: 2020-12-27 | Discharge: 2020-12-27 | Disposition: A | Discharge status: Home / Self Care | Payer: 59 | Source: Ambulatory Visit | Attending: Obstetrics and Gynecology | Admitting: Obstetrics and Gynecology

## 2020-12-27 ENCOUNTER — Other Ambulatory Visit: Payer: Self-pay

## 2020-12-27 ENCOUNTER — Ambulatory Visit (INDEPENDENT_AMBULATORY_CARE_PROVIDER_SITE_OTHER): Payer: 59 | Admitting: Obstetrics and Gynecology

## 2020-12-27 ENCOUNTER — Encounter: Payer: Self-pay | Admitting: Obstetrics and Gynecology

## 2020-12-27 VITALS — BP 96/52 | Ht 60.0 in | Wt 100.0 lb

## 2020-12-27 DIAGNOSIS — N952 Postmenopausal atrophic vaginitis: Secondary | ICD-10-CM

## 2020-12-27 DIAGNOSIS — Z124 Encounter for screening for malignant neoplasm of cervix: Secondary | ICD-10-CM | POA: Insufficient documentation

## 2020-12-27 DIAGNOSIS — N941 Unspecified dyspareunia: Secondary | ICD-10-CM | POA: Diagnosis not present

## 2020-12-27 MED ORDER — INTRAROSA 6.5 MG VA INST: 6.5000 mg | VAGINAL_INSERT | Freq: Every day | VAGINAL | 11 refills | Status: AC

## 2020-12-27 NOTE — Progress Notes (Signed)
Patient ID: Breanna Hamilton, female   DOB: 03/06/1971, 50 y.o.   MRN: [id number]  Reason for Consult: Vaginal Pain (Referral vaginal pain - RM 2)   Referred by Oswaldo Conroy, MD  Subjective:     HPI:  Breanna Hamilton is a 50 y.o. female.  She is following up again for vaginal pain that she is having with intercourse.  I have seen her previously for this problem.  She has not wanted to start hormone replacement therapy or use vaginal estrogen in the past because of breast concerns.  I had previously prescribed Intrarosa.  She reports that she did not use this medication very long before discontinuing it when not seeing an improvement.  She was using clobetasol for lichen sclerosus appearance although she discontinued this several months ago at the behest of her primary care physician. She continues to have itching and pain, especially on the left.   Gynecological History  Patient's last menstrual period was 07/04/2017. Menarche: 13 Menopause: 47  History of fibroids, polyps, or ovarian cysts? : no  History of PCOS? no Hstory of Endometriosis? no History of abnormal pap smears? no Have you had any sexually transmitted infections in the past? no  Last Pap: Results were: 2018 She identifies as a female. She is sexually active with men.   She has dyspareunia. She denies postcoital bleeding.   Obstetrical History OB History  Gravida Para Term Preterm AB Living  3         3  SAB IAB Ectopic Multiple Live Births               # Outcome Date GA Lbr Len/2nd Weight Sex Delivery Anes PTL Lv  3 Gravida           2 Gravida           1 Gravida              History reviewed. No pertinent past medical history. Family History  Problem Relation Age of Onset  . Cancer Mother    History reviewed. No pertinent surgical history.  Short Social History:  Social History   Tobacco Use  . Smoking status: Never Smoker  . Smokeless tobacco: Never Used  Substance Use Topics   . Alcohol use: Never    No Known Allergies  Current Outpatient Medications  Medication Sig Dispense Refill  . clobetasol ointment (TEMOVATE) 0.05 % Apply to affected area twice a week on Monday and Thursday at bedtime. 30 g 5  . Prasterone (INTRAROSA) 6.5 MG INST Place 6.5 mg vaginally at bedtime. 30 each 11   No current facility-administered medications for this visit.    Review of Systems  Constitutional: Negative for chills, fatigue, fever and unexpected weight change.  HENT: Negative for trouble swallowing.  Eyes: Negative for loss of vision.  Respiratory: Negative for cough, shortness of breath and wheezing.  Cardiovascular: Negative for chest pain, leg swelling, palpitations and syncope.  GI: Negative for abdominal pain, blood in stool, diarrhea, nausea and vomiting.  GU: Negative for difficulty urinating, dysuria, frequency and hematuria.  Musculoskeletal: Negative for back pain, leg pain and joint pain.  Skin: Negative for rash.  Neurological: Negative for dizziness, headaches, light-headedness, numbness and seizures.  Psychiatric: Negative for behavioral problem, confusion, depressed mood and sleep disturbance.        Objective:  Objective   Vitals:   12/27/20 1522  BP: (!) 96/52  Weight: 100 lb (45.4 kg)  Height: 5' (1.524  m)   Body mass index is 19.53 kg/m.  Physical Exam Vitals and nursing note reviewed. Exam conducted with a chaperone present.  Constitutional:      Appearance: Normal appearance. She is well-developed.  HENT:     Head: Normocephalic and atraumatic.  Eyes:     Extraocular Movements: Extraocular movements intact.     Pupils: Pupils are equal, round, and reactive to light.  Cardiovascular:     Rate and Rhythm: Normal rate and regular rhythm.  Pulmonary:     Effort: Pulmonary effort is normal. No respiratory distress.     Breath sounds: Normal breath sounds.  Abdominal:     General: Abdomen is flat.     Palpations: Abdomen is soft.   Genitourinary:      Comments: External: Normal appearing vulva. No lesions noted.  Speculum examination: Normal appearing cervix. No blood in the vaginal vault. no discharge.   Bimanual examination: Uterus midline, non-tender, normal in size, shape and contour.  No CMT. No adnexal masses. No adnexal tenderness. Pelvis not fixed.  Breast exam: exam not performed Musculoskeletal:        General: No signs of injury.  Skin:    General: Skin is warm and dry.  Neurological:     Mental Status: She is alert and oriented to person, place, and time.  Psychiatric:        Behavior: Behavior normal.        Thought Content: Thought content normal.        Judgment: Judgment normal.     Assessment/Plan:     50 yo with menopause related dyspareunia.  She stopped intrarosa several months ago. Discussed that it should be used for 4 weeks before determining treatment success or failure.  Discussed Osphena as an alternative to [place].  She wishes to trial Intrarosa again.  Provided with a list of OTC vaginal and vulvar moisturizers and lubricants.  Follow up in  4 weeks.   More than 20 minutes were spent face to face with the patient in the room, reviewing the medical record, labs and images, and coordinating care for the patient. The plan of management was discussed in detail and counseling was provided.     Adelene Idler MD [facility], [facility] 12/27/2020 3:51 PM

## 2020-12-27 NOTE — Patient Instructions (Signed)
Vaginal/ Vulvar Moisturizer Use 3-5 times a week at bedtime Hyalo Gyn Revaree Replens  Water-based Lubricants  Astroglide KY Jelly  Luvena Aquagel  Silicone- based Lubricants Pjur PINK Astroglide silicone Uberlube

## 2020-12-29 ENCOUNTER — Telehealth: Payer: Self-pay

## 2020-12-29 LAB — CYTOLOGY - PAP
Comment: NEGATIVE
Diagnosis: NEGATIVE
High risk HPV: NEGATIVE

## 2020-12-29 NOTE — Telephone Encounter (Signed)
Pt's daughter, Elissa Hefty, calling; states rx was $250; is there a coupon or a cheaper medication?  [phone]

## 2020-12-29 NOTE — Telephone Encounter (Signed)
Here is a link for a savings card:  [email or web address].[email or web address]  Please let the patient know, thank you

## 2020-12-30 NOTE — Telephone Encounter (Signed)
Mailbox not setup

## 2020-12-31 NOTE — Telephone Encounter (Signed)
Spoke c [place], link given.

## 2021-01-18 ENCOUNTER — Telehealth: Payer: Self-pay

## 2021-01-18 NOTE — Telephone Encounter (Signed)
Pt's dtr, Tobi Bastos, calling; pt has questions about vaginal inserts.  [phone] vm not set up yet.

## 2021-01-19 NOTE — Telephone Encounter (Signed)
Pt's dtr states pt took the vag tablets for two days but stopped d/t a liquid coming out.  Normal?  Adv normal; Tobi Bastos to let pt know.

## 2021-01-27 ENCOUNTER — Encounter: Payer: Self-pay | Admitting: Obstetrics and Gynecology

## 2021-01-27 ENCOUNTER — Ambulatory Visit (INDEPENDENT_AMBULATORY_CARE_PROVIDER_SITE_OTHER): Payer: 59 | Admitting: Obstetrics and Gynecology

## 2021-01-27 ENCOUNTER — Other Ambulatory Visit: Payer: Self-pay

## 2021-01-27 VITALS — BP 110/64 | Wt 100.6 lb

## 2021-01-27 DIAGNOSIS — N952 Postmenopausal atrophic vaginitis: Secondary | ICD-10-CM

## 2021-01-27 DIAGNOSIS — N941 Unspecified dyspareunia: Secondary | ICD-10-CM

## 2021-01-27 NOTE — Progress Notes (Signed)
Patient ID: Breanna Hamilton, female   DOB: 03/10/1971, 50 y.o.   MRN: [id number]  Reason for Consult: No chief complaint on file.   Referred by Oswaldo Conroy, MD  Subjective:     HPI:  Breanna Hamilton is a 50 y.o. female.  she is following up today regarding painful intercourse.  At her last visit we reviewed options for treating vaginal atrophy to help with discomfort during intercourse.  She was most interested in [place] and a prescription for that medication was sent .  She reports that she use the Intrarosa twice however she noticed increased vaginal discharge.  For this reason she discontinued using the medication and decided to follow-up to see if that was normal. No past medical history on file. Family History  Problem Relation Age of Onset  . Cancer Mother    No past surgical history on file.  Short Social History:  Social History   Tobacco Use  . Smoking status: Never Smoker  . Smokeless tobacco: Never Used  Substance Use Topics  . Alcohol use: Never    No Known Allergies  Current Outpatient Medications  Medication Sig Dispense Refill  . clobetasol ointment (TEMOVATE) 0.05 % Apply to affected area twice a week on Monday and Thursday at bedtime. 30 g 5  . Prasterone (INTRAROSA) 6.5 MG INST Place 6.5 mg vaginally at bedtime. 30 each 11   No current facility-administered medications for this visit.    REVIEW OF SYSTEMS      Objective:  Objective   Vitals:   01/27/21 1511  BP: 110/64  Weight: 100 lb 9.6 oz (45.6 kg)   Body mass index is 19.65 kg/m.  Physical Exam Vitals and nursing note reviewed. Exam conducted with a chaperone present.  Constitutional:      Appearance: Normal appearance. She is well-developed.  HENT:     Head: Normocephalic and atraumatic.  Eyes:     Extraocular Movements: Extraocular movements intact.     Pupils: Pupils are equal, round, and reactive to light.  Cardiovascular:     Rate and Rhythm: Normal rate  and regular rhythm.  Pulmonary:     Effort: Pulmonary effort is normal. No respiratory distress.     Breath sounds: Normal breath sounds.  Abdominal:     General: Abdomen is flat.     Palpations: Abdomen is soft.  Genitourinary:    Comments: External: Normal appearing vulva. No lesions noted.  Speculum examination: Normal appearing cervix. No blood in the vaginal vault. Scant white  discharge.   Bimanual examination: Uterus midline, non-tender, normal in size, shape and contour.  No CMT. No adnexal masses. No adnexal tenderness. Pelvis not fixed.  Breast exam: exam not performed Musculoskeletal:        General: No signs of injury.  Skin:    General: Skin is warm and dry.  Neurological:     Mental Status: She is alert and oriented to person, place, and time.  Psychiatric:        Behavior: Behavior normal.        Thought Content: Thought content normal.        Judgment: Judgment normal.     Assessment/Plan:     50 year old with menopause related dyspareunia discussed options for continuing with Intrarosa versus switching to oral medications such as Osphena or hormone replacement therapy.  Patient would like to retry the Intrarosa vaginal inserts.  She will follow-up in 4 weeks to assess for improvement.  Discussed that it is  normal to have increased vaginal discharge with using the ointment.  Recommended the ointment be placed at nighttime.  More than 15 minutes were spent face to face with the patient in the room, reviewing the medical record, labs and images, and coordinating care for the patient. The plan of management was discussed in detail and counseling was provided.      Adelene Idler MD [facility], [facility] 01/27/2021 3:17 PM

## 2021-02-25 ENCOUNTER — Ambulatory Visit: Payer: 59 | Admitting: Obstetrics and Gynecology

## 2021-08-01 DIAGNOSIS — G4700 Insomnia, unspecified: Secondary | ICD-10-CM | POA: Diagnosis not present

## 2021-08-01 DIAGNOSIS — Z1389 Encounter for screening for other disorder: Secondary | ICD-10-CM | POA: Diagnosis not present

## 2021-09-15 DIAGNOSIS — R69 Illness, unspecified: Secondary | ICD-10-CM | POA: Diagnosis not present

## 2021-09-15 DIAGNOSIS — G4700 Insomnia, unspecified: Secondary | ICD-10-CM | POA: Diagnosis not present

## 2022-05-23 DIAGNOSIS — Z1389 Encounter for screening for other disorder: Secondary | ICD-10-CM | POA: Diagnosis not present

## 2022-05-23 DIAGNOSIS — Z0000 Encounter for general adult medical examination without abnormal findings: Secondary | ICD-10-CM | POA: Diagnosis not present

## 2022-05-24 ENCOUNTER — Other Ambulatory Visit: Payer: Self-pay | Admitting: Family Medicine

## 2022-05-24 DIAGNOSIS — Z1231 Encounter for screening mammogram for malignant neoplasm of breast: Secondary | ICD-10-CM

## 2022-12-07 ENCOUNTER — Encounter: Payer: Self-pay | Admitting: Obstetrics and Gynecology

## 2022-12-13 DIAGNOSIS — Z1211 Encounter for screening for malignant neoplasm of colon: Secondary | ICD-10-CM | POA: Diagnosis not present

## 2023-03-22 ENCOUNTER — Encounter: Payer: Self-pay | Admitting: Gastroenterology

## 2023-03-22 NOTE — H&P (Signed)
Is dictating the notes for tomorrow  Pre-Procedure H&P   Patient ID: Breanna Hamilton is a 52 y.o. female.  Gastroenterology Provider: Jaynie Collins, DO  Referring Provider: Dr. Hessie Hamilton PCP: Breanna Conroy, MD  Date: 03/23/2023  HPI Ms. Breanna Hamilton is a 52 y.o. female who presents today for Colonoscopy for Colorectal cancer screening .  Information is garnered via video medical Spanish interpreter. Initial colorectal cancer screening.  No family history of colon cancer or colon polyps.  Reports daily bowel movement without melena or hematochezia.   History reviewed. No pertinent past medical history.  History reviewed. No pertinent surgical history.  Family History No h/o GI disease or malignancy  Review of Systems  Constitutional:  Negative for activity change, appetite change, chills, diaphoresis, fatigue, fever and unexpected weight change.  HENT:  Negative for trouble swallowing and voice change.   Respiratory:  Negative for shortness of breath and wheezing.   Cardiovascular:  Negative for chest pain, palpitations and leg swelling.  Gastrointestinal:  Negative for abdominal distention, abdominal pain, anal bleeding, blood in stool, constipation, diarrhea, nausea, rectal pain and vomiting.  Musculoskeletal:  Negative for arthralgias and myalgias.  Skin:  Negative for color change and pallor.  Neurological:  Negative for dizziness, syncope and weakness.  Psychiatric/Behavioral:  Negative for confusion.   All other systems reviewed and are negative.    Medications No current facility-administered medications on file prior to encounter.   Current Outpatient Medications on File Prior to Encounter  Medication Sig Dispense Refill   clobetasol ointment (TEMOVATE) 0.05 % Apply to affected area twice a week on Monday and Thursday at bedtime. 30 g 5   Prasterone (INTRAROSA) 6.5 MG INST Place 6.5 mg vaginally at bedtime. 30 each 11    Pertinent  medications related to GI and procedure were reviewed by me with the patient prior to the procedure   Current Facility-Administered Medications:    0.9 %  sodium chloride infusion, , Intravenous, Continuous, Breanna Collins, DO, Last Rate: 20 mL/hr at 03/23/23 0806, New Bag at 03/23/23 0806  sodium chloride 20 mL/hr at 03/23/23 8295       No Known Allergies Allergies were reviewed by me prior to the procedure  Objective   Body mass index is 18.94 kg/m. Vitals:   03/23/23 0747  BP: 120/70  Pulse: 75  Resp: 20  Temp: (!) 96.4 F (35.8 C)  TempSrc: Temporal  SpO2: 100%  Weight: 44 kg  Height: 5' (1.524 m)     Physical Exam Vitals and nursing note reviewed.  Constitutional:      General: She is not in acute distress.    Appearance: Normal appearance. She is not ill-appearing, toxic-appearing or diaphoretic.  HENT:     Head: Normocephalic and atraumatic.     Nose: Nose normal.     Mouth/Throat:     Mouth: Mucous membranes are moist.     Pharynx: Oropharynx is clear.  Eyes:     General: No scleral icterus.    Extraocular Movements: Extraocular movements intact.  Cardiovascular:     Rate and Rhythm: Normal rate and regular rhythm.     Heart sounds: Normal heart sounds. No murmur heard.    No friction rub. No gallop.  Pulmonary:     Effort: Pulmonary effort is normal. No respiratory distress.     Breath sounds: Normal breath sounds. No wheezing, rhonchi or rales.  Abdominal:     General: Abdomen is flat. Bowel sounds are normal.  There is no distension.     Palpations: Abdomen is soft.     Tenderness: There is no abdominal tenderness. There is no guarding or rebound.  Musculoskeletal:     Cervical back: Neck supple.     Right lower leg: No edema.     Left lower leg: No edema.  Skin:    General: Skin is warm and dry.     Coloration: Skin is not jaundiced or pale.  Neurological:     General: No focal deficit present.     Mental Status: She is alert and  oriented to person, place, and time. Mental status is at baseline.  Psychiatric:        Mood and Affect: Mood normal.        Behavior: Behavior normal.        Thought Content: Thought content normal.        Judgment: Judgment normal.      Assessment:  Ms. Breanna Hamilton is a 52 y.o. female  who presents today for Colonoscopy for Colorectal cancer screening .  Plan:  Colonoscopy with possible intervention today  Colonoscopy with possible biopsy, control of bleeding, polypectomy, and interventions as necessary has been discussed with the patient/patient representative. Informed consent was obtained from the patient/patient representative after explaining the indication, nature, and risks of the procedure including but not limited to death, bleeding, perforation, missed neoplasm/lesions, cardiorespiratory compromise, and reaction to medications. Opportunity for questions was given and appropriate answers were provided. Patient/patient representative has verbalized understanding is amenable to undergoing the procedure.   Breanna Collins, DO  [facility]  Portions of the record may have been created with voice recognition software. Occasional wrong-word or 'sound-a-like' substitutions may have occurred due to the inherent limitations of voice recognition software.  Read the chart carefully and recognize, using context, where substitutions may have occurred.

## 2023-03-23 ENCOUNTER — Encounter
Admission: RE | Disposition: A | Discharge status: Home / Self Care | Payer: Self-pay | Source: Home / Self Care | Attending: Gastroenterology

## 2023-03-23 ENCOUNTER — Encounter: Payer: Self-pay | Admitting: Gastroenterology

## 2023-03-23 ENCOUNTER — Ambulatory Visit: Payer: 59 | Admitting: Certified Registered"

## 2023-03-23 ENCOUNTER — Ambulatory Visit
Admission: RE | Admit: 2023-03-23 | Discharge: 2023-03-23 | Disposition: A | Discharge status: Home / Self Care | Payer: 59 | Source: Home / Self Care | Attending: Gastroenterology | Admitting: Gastroenterology

## 2023-03-23 DIAGNOSIS — Z1211 Encounter for screening for malignant neoplasm of colon: Secondary | ICD-10-CM | POA: Diagnosis not present

## 2023-03-23 HISTORY — PX: COLONOSCOPY WITH PROPOFOL: SHX5780

## 2023-03-23 SURGERY — COLONOSCOPY WITH PROPOFOL
Anesthesia: General

## 2023-03-23 MED ORDER — SODIUM CHLORIDE 0.9 % IV SOLN: INTRAVENOUS | Status: DC

## 2023-03-23 MED ORDER — LIDOCAINE HCL (CARDIAC) PF 100 MG/5ML IV SOSY
PREFILLED_SYRINGE | INTRAVENOUS | Status: DC | PRN
  Administered 2023-03-23: 50 mg via INTRAVENOUS

## 2023-03-23 MED ORDER — PROPOFOL 10 MG/ML IV BOLUS
INTRAVENOUS | Status: DC | PRN
  Administered 2023-03-23: 30 mg via INTRAVENOUS
  Administered 2023-03-23: 50 mg via INTRAVENOUS
  Administered 2023-03-23: 100 ug/kg/min via INTRAVENOUS

## 2023-03-23 MED ORDER — DEXMEDETOMIDINE HCL IN NACL 200 MCG/50ML IV SOLN
INTRAVENOUS | Status: DC | PRN
Start: 2023-03-23 — End: 2023-03-23
  Administered 2023-03-23: 8 ug via INTRAVENOUS

## 2023-03-23 MED ORDER — PHENYLEPHRINE HCL (PRESSORS) 10 MG/ML IV SOLN
INTRAVENOUS | Status: DC | PRN
  Administered 2023-03-23: 80 ug via INTRAVENOUS

## 2023-03-23 NOTE — Anesthesia Postprocedure Evaluation (Signed)
Anesthesia Post Note  Patient: Breanna Hamilton  Procedure(s) Performed: COLONOSCOPY WITH PROPOFOL  Patient location during evaluation: PACU Anesthesia Type: General Level of consciousness: awake and awake and alert Pain management: pain level controlled Vital Signs Assessment: post-procedure vital signs reviewed and stable Respiratory status: spontaneous breathing and nonlabored ventilation Cardiovascular status: blood pressure returned to baseline Anesthetic complications: no   There were no known notable events for this encounter.   Last Vitals:  Vitals:   03/23/23 0747 03/23/23 0846  BP: 120/70 101/62  Pulse: 75 69  Resp: 20 20  Temp: (!) 35.8 C 36.6 C  SpO2: 100% 100%    Last Pain:  Vitals:   03/23/23 0846  TempSrc: Temporal                 VAN STAVEREN,Leonia Heatherly

## 2023-03-23 NOTE — Interval H&P Note (Signed)
History and Physical Interval Note: Preprocedure H&P from 03/23/23  was reviewed and there was no interval change after seeing and examining the patient.  Written consent was obtained from the patient after discussion of risks, benefits, and alternatives. Patient has consented to proceed with Colonoscopy with possible intervention   03/23/2023 8:18 AM  Breanna Hamilton Breanna Hamilton  has presented today for surgery, with the diagnosis of Z12.11 (ICD-10-CM) - Colon cancer screening.  The various methods of treatment have been discussed with the patient and family. After consideration of risks, benefits and other options for treatment, the patient has consented to  Procedure(s): COLONOSCOPY WITH PROPOFOL (N/A) as a surgical intervention.  The patient's history has been reviewed, patient examined, no change in status, stable for surgery.  I have reviewed the patient's chart and labs.  Questions were answered to the patient's satisfaction.     Breanna Hamilton

## 2023-03-23 NOTE — Op Note (Signed)
[facility] Patient Name: Breanna Hamilton Procedure Date: 03/23/2023 8:07 AM MRN: [id number] Account #: [id number] Date of Birth: [birth year] Admit Type: Outpatient Age: 52 Room: [facility] ENDO ROOM 1 Gender: Female Note Status: Finalized Instrument Name: Peds Colonoscope [id number] Procedure:             Colonoscopy Indications:           Screening for colorectal malignant neoplasm Providers:             Jaynie Collins DO, DO Referring MD:          Oswaldo Done. Hessie Diener MD, MD (Referring MD) Medicines:             Monitored Anesthesia Care Complications:         No immediate complications. Estimated blood loss: None. Procedure:             Pre-Anesthesia Assessment:                        - Prior to the procedure, a History and Physical was                         performed, and patient medications and allergies were                         reviewed. The patient is competent. The risks and                         benefits of the procedure and the sedation options and                         risks were discussed with the patient. All questions                         were answered and informed consent was obtained.                         Patient identification and proposed procedure were                         verified by the physician, the nurse, the anesthetist                         and the technician in the endoscopy suite. Mental                         Status Examination: alert and oriented. Airway                         Examination: normal oropharyngeal airway and neck                         mobility. Respiratory Examination: clear to                         auscultation. CV Examination: RRR, no murmurs, no S3                         or S4. Prophylactic Antibiotics: The patient does not  require prophylactic antibiotics. Prior                         Anticoagulants: The patient has taken no anticoagulant                          or antiplatelet agents. ASA Grade Assessment: II - A                         patient with mild systemic disease. After reviewing                         the risks and benefits, the patient was deemed in                         satisfactory condition to undergo the procedure. The                         anesthesia plan was to use monitored anesthesia care                         (MAC). Immediately prior to administration of                         medications, the patient was re-assessed for adequacy                         to receive sedatives. The heart rate, respiratory                         rate, oxygen saturations, blood pressure, adequacy of                         pulmonary ventilation, and response to care were                         monitored throughout the procedure. The physical                         status of the patient was re-assessed after the                         procedure.                        After obtaining informed consent, the colonoscope was                         passed under direct vision. Throughout the procedure,                         the patient's blood pressure, pulse, and oxygen                         saturations were monitored continuously. The                         Colonoscope was introduced through the anus and  advanced to the the terminal ileum, with                         identification of the appendiceal orifice and IC                         valve. The colonoscopy was performed without                         difficulty. The patient tolerated the procedure well.                         The quality of the bowel preparation was evaluated                         using the BBPS [facility] Bowel Preparation Scale) with                         scores of: Right Colon = 3, Transverse Colon = 3 and                         Left Colon = 3 (entire mucosa seen well with no                         residual staining, small  fragments of stool or opaque                         liquid). The total BBPS score equals 9. The terminal                         ileum, ileocecal valve, appendiceal orifice, and                         rectum were photographed. Findings:      The perianal and digital rectal examinations were normal. Pertinent       negatives include normal sphincter tone.      The terminal ileum appeared normal. Estimated blood loss: none.      Retroflexion in the right colon was performed.      The entire examined colon appeared normal on direct and retroflexion       views. Impression:            - The examined portion of the ileum was normal.                        - The entire examined colon is normal on direct and                         retroflexion views.                        - No specimens collected.                        - La porcin examinada del leon era normal.                        - Todo el colon examinado es normal en las vistas  directa y en retroflexin.                        - No se recolectaron especmenes. Recommendation:        - Patient has a contact number available for                         emergencies. The signs and symptoms of potential                         delayed complications were discussed with the patient.                         Return to normal activities tomorrow. Written                         discharge instructions were provided to the patient.                        - Discharge patient to home.                        - Resume previous diet.                        - Continue present medications.                        - Repeat colonoscopy in 10 years for screening                         purposes.                        - Return to referring physician as previously                         scheduled.                        - The findings and recommendations were discussed with                         the patient. Procedure  Code(s):     --- Professional ---                        [phone], Colonoscopy, flexible; diagnostic, including                         collection of specimen(s) by brushing or washing, when                         performed (separate procedure) Diagnosis Code(s):     --- Professional ---                        Z12.11, Encounter for screening for malignant neoplasm                         of colon CPT copyright 2022 [employer]. All rights reserved. The codes documented in this report are preliminary  and upon coder review may  be revised to meet current compliance requirements. Attending Participation:      I personally performed the entire procedure. Elfredia Nevins, DO Jaynie Collins DO, DO 03/23/2023 8:43:04 AM This report has been signed electronically. Number of Addenda: 0 Note Initiated On: 03/23/2023 8:07 AM Scope Withdrawal Time: 0 hours 8 minutes 43 seconds  Total Procedure Duration: 0 hours 15 minutes 45 seconds  Estimated Blood Loss:  Estimated blood loss: none.      [facility]

## 2023-03-23 NOTE — Anesthesia Preprocedure Evaluation (Signed)
Anesthesia Evaluation  Patient identified by MRN, date of birth, ID band Patient awake    Reviewed: Allergy & Precautions, NPO status , Patient's Chart, lab work & pertinent test results  Airway Mallampati: II  TM Distance: >3 FB Neck ROM: Full  Mouth opening: Limited Mouth Opening  Dental  (+) Teeth Intact, Implants   Pulmonary neg pulmonary ROS   Pulmonary exam normal breath sounds clear to auscultation       Cardiovascular Exercise Tolerance: Good negative cardio ROS Normal cardiovascular exam+ dysrhythmias  Rhythm:Regular Rate:Normal     Neuro/Psych negative neurological ROS  negative psych ROS   GI/Hepatic negative GI ROS, Neg liver ROS,,,  Endo/Other  negative endocrine ROS    Renal/GU negative Renal ROS  negative genitourinary   Musculoskeletal   Abdominal Normal abdominal exam  (+)   Peds negative pediatric ROS (+)  Hematology negative hematology ROS (+)   Anesthesia Other Findings History reviewed. No pertinent past medical history.  History reviewed. No pertinent surgical history.     Reproductive/Obstetrics negative OB ROS                             Anesthesia Physical Anesthesia Plan  ASA: 2  Anesthesia Plan: General   Post-op Pain Management:    Induction: Intravenous  PONV Risk Score and Plan: Propofol infusion and TIVA  Airway Management Planned: Natural Airway  Additional Equipment:   Intra-op Plan:   Post-operative Plan:   Informed Consent: I have reviewed the patients History and Physical, chart, labs and discussed the procedure including the risks, benefits and alternatives for the proposed anesthesia with the patient or authorized representative who has indicated his/her understanding and acceptance.     Dental Advisory Given  Plan Discussed with: CRNA and Surgeon  Anesthesia Plan Comments:        Anesthesia Quick Evaluation

## 2023-03-23 NOTE — Transfer of Care (Signed)
Immediate Anesthesia Transfer of Care Note  Patient: Breanna Hamilton  Procedure(s) Performed: COLONOSCOPY WITH PROPOFOL  Patient Location: PACU  Anesthesia Type:General  Level of Consciousness: awake, alert , and oriented  Airway & Oxygen Therapy: Patient Spontanous Breathing  Post-op Assessment: Report given to RN  Post vital signs: stable  Last Vitals:  Vitals Value Taken Time  BP    Temp    Pulse 70 03/23/23 0845  Resp 21 03/23/23 0845  SpO2 100 % 03/23/23 0845  Vitals shown include unfiled device data.  Last Pain:  Vitals:   03/23/23 0747  TempSrc: Temporal         Complications: No notable events documented.

## 2023-03-26 ENCOUNTER — Encounter: Payer: Self-pay | Admitting: Gastroenterology

## 2023-06-25 DIAGNOSIS — R7309 Other abnormal glucose: Secondary | ICD-10-CM | POA: Diagnosis not present

## 2023-06-25 DIAGNOSIS — Z1389 Encounter for screening for other disorder: Secondary | ICD-10-CM | POA: Diagnosis not present

## 2023-06-25 DIAGNOSIS — Z0000 Encounter for general adult medical examination without abnormal findings: Secondary | ICD-10-CM | POA: Diagnosis not present

## 2023-06-25 DIAGNOSIS — Z1331 Encounter for screening for depression: Secondary | ICD-10-CM | POA: Diagnosis not present

## 2023-06-26 ENCOUNTER — Other Ambulatory Visit: Payer: Self-pay | Admitting: Family Medicine

## 2023-06-26 DIAGNOSIS — Z1231 Encounter for screening mammogram for malignant neoplasm of breast: Secondary | ICD-10-CM

## 2023-10-12 ENCOUNTER — Ambulatory Visit
Admission: RE | Admit: 2023-10-12 | Discharge: 2023-10-12 | Disposition: A | Discharge status: Home / Self Care | Payer: 59 | Source: Ambulatory Visit | Attending: Family Medicine | Admitting: Family Medicine

## 2023-10-12 DIAGNOSIS — Z1231 Encounter for screening mammogram for malignant neoplasm of breast: Secondary | ICD-10-CM | POA: Insufficient documentation

## 2023-10-26 ENCOUNTER — Ambulatory Visit: Payer: Self-pay

## 2023-10-26 DIAGNOSIS — Z23 Encounter for immunization: Secondary | ICD-10-CM | POA: Diagnosis not present

## 2023-10-26 DIAGNOSIS — Z719 Counseling, unspecified: Secondary | ICD-10-CM

## 2023-10-26 NOTE — Progress Notes (Signed)
 Client seen in nurse clinic for vaccines needed for immigration.  Interpretation provided by Kenard Gower.   Vaccines administered and tolerated well, after vaccine care reviewed.   Copy of [facility] provided.  Breanna Hamilton R Coleby Yett

## 2024-02-11 DIAGNOSIS — F418 Other specified anxiety disorders: Secondary | ICD-10-CM | POA: Diagnosis not present

## 2024-02-11 DIAGNOSIS — R519 Headache, unspecified: Secondary | ICD-10-CM | POA: Diagnosis not present

## 2024-10-03 ENCOUNTER — Telehealth: Payer: Self-pay
